# Patient Record
Sex: Male | Born: 1950 | Race: White | Hispanic: No | Marital: Married | Smoking: Never smoker
Health system: Southern US, Community
[De-identification: ages and names within clinical notes are randomized; demographics above are authoritative.]

## PROBLEM LIST (undated history)

## (undated) DIAGNOSIS — A4902 Methicillin resistant Staphylococcus aureus infection, unspecified site: Secondary | ICD-10-CM

## (undated) DIAGNOSIS — I10 Essential (primary) hypertension: Secondary | ICD-10-CM

## (undated) DIAGNOSIS — K219 Gastro-esophageal reflux disease without esophagitis: Secondary | ICD-10-CM

## (undated) DIAGNOSIS — E7800 Pure hypercholesterolemia, unspecified: Secondary | ICD-10-CM

## (undated) DIAGNOSIS — M1990 Unspecified osteoarthritis, unspecified site: Secondary | ICD-10-CM

## (undated) DIAGNOSIS — D649 Anemia, unspecified: Secondary | ICD-10-CM

## (undated) DIAGNOSIS — J343 Hypertrophy of nasal turbinates: Secondary | ICD-10-CM

## (undated) DIAGNOSIS — J342 Deviated nasal septum: Secondary | ICD-10-CM

## (undated) HISTORY — PX: WISDOM TOOTH EXTRACTION: SHX21

## (undated) HISTORY — PX: COLONOSCOPY: SHX174

---

## 2017-04-02 ENCOUNTER — Ambulatory Visit (INDEPENDENT_AMBULATORY_CARE_PROVIDER_SITE_OTHER): Payer: Medicare HMO

## 2017-04-02 ENCOUNTER — Encounter: Payer: Self-pay | Admitting: Podiatry

## 2017-04-02 ENCOUNTER — Ambulatory Visit (INDEPENDENT_AMBULATORY_CARE_PROVIDER_SITE_OTHER): Payer: Medicare HMO | Admitting: Podiatry

## 2017-04-02 DIAGNOSIS — M79671 Pain in right foot: Secondary | ICD-10-CM

## 2017-04-02 DIAGNOSIS — M779 Enthesopathy, unspecified: Secondary | ICD-10-CM

## 2017-04-02 NOTE — Progress Notes (Signed)
   Subjective:    Patient ID: Ryan Berger, male    DOB: 07/14/1951, 66 y.o.   MRN: [id number]  HPI Chief Complaint  Patient presents with  . Plantar Fasciitis    Rt foot heel/arch/lateral side is painful for over 1 week      Review of Systems  All other systems reviewed and are negative.      Objective:   Physical Exam        Assessment & Plan:

## 2017-04-02 NOTE — Progress Notes (Signed)
Subjective:    Patient ID: Ryan SimasShawn Berger, male   DOB: 66 y.o.   MRN: [id number]   HPI patient presents stating he's develop a lot of pain on the top outside of his right foot over the last month and he's not sure about injury. States the pain has moderated some but it still swelling and he wanted to get it checked    Review of Systems  All other systems reviewed and are negative.       Objective:  Physical Exam  Cardiovascular: Intact distal pulses.   Pulmonary/Chest: Breath sounds normal.  Musculoskeletal: Normal range of motion.  Neurological: He is alert.  Skin: Skin is warm.  Nursing note and vitals reviewed.  neurovascular status intact muscle strength adequate range of motion within normal limits with patient found to have edema with mild pain and irritation around the peroneal tendon group lateral side right ankle with no indication of tendon dysfunction. Patient's found have good digital perfusion and is well oriented 3     Assessment:   Inflammatory tendinitis lateral side right foot     Plan:   H&P condition reviewed and today I advised on aggressive ice therapy I dispensed a fascial brace to lift up the lateral side of the foot and I advised on reduced activity. Reappoint for us to recheck  X-rays taken today negative for signs of fracture or arthritic condition

## 2018-09-23 DIAGNOSIS — A4902 Methicillin resistant Staphylococcus aureus infection, unspecified site: Secondary | ICD-10-CM

## 2018-09-23 HISTORY — DX: Methicillin resistant Staphylococcus aureus infection, unspecified site: A49.02

## 2018-10-07 ENCOUNTER — Encounter (HOSPITAL_BASED_OUTPATIENT_CLINIC_OR_DEPARTMENT_OTHER): Payer: Self-pay | Admitting: *Deleted

## 2018-10-07 ENCOUNTER — Other Ambulatory Visit: Payer: Self-pay | Admitting: Otolaryngology

## 2018-10-07 ENCOUNTER — Other Ambulatory Visit: Payer: Self-pay

## 2018-10-07 NOTE — Progress Notes (Signed)
Patient states he is currently being treated for a MRSA infection on his wasteline with Doxycycline and topical mupirocin. Heather at Dr Avel Sensor office notified and she stated that he is already aware.

## 2018-11-23 ENCOUNTER — Encounter (HOSPITAL_BASED_OUTPATIENT_CLINIC_OR_DEPARTMENT_OTHER): Payer: Self-pay | Admitting: *Deleted

## 2018-11-23 ENCOUNTER — Other Ambulatory Visit: Payer: Self-pay

## 2018-11-24 ENCOUNTER — Encounter (HOSPITAL_BASED_OUTPATIENT_CLINIC_OR_DEPARTMENT_OTHER)
Admission: RE | Admit: 2018-11-24 | Discharge: 2018-11-24 | Disposition: A | Discharge status: Home / Self Care | Payer: Medicare HMO | Source: Ambulatory Visit | Attending: Otolaryngology | Admitting: Otolaryngology

## 2018-11-24 DIAGNOSIS — Z01810 Encounter for preprocedural cardiovascular examination: Secondary | ICD-10-CM | POA: Insufficient documentation

## 2018-12-01 ENCOUNTER — Other Ambulatory Visit: Payer: Self-pay

## 2018-12-01 ENCOUNTER — Ambulatory Visit (HOSPITAL_BASED_OUTPATIENT_CLINIC_OR_DEPARTMENT_OTHER): Payer: Medicare HMO | Admitting: Certified Registered"

## 2018-12-01 ENCOUNTER — Encounter (HOSPITAL_BASED_OUTPATIENT_CLINIC_OR_DEPARTMENT_OTHER): Payer: Self-pay | Admitting: *Deleted

## 2018-12-01 ENCOUNTER — Encounter (HOSPITAL_BASED_OUTPATIENT_CLINIC_OR_DEPARTMENT_OTHER)
Admission: RE | Disposition: A | Discharge status: Home / Self Care | Payer: Self-pay | Source: Ambulatory Visit | Attending: Otolaryngology

## 2018-12-01 ENCOUNTER — Ambulatory Visit (HOSPITAL_BASED_OUTPATIENT_CLINIC_OR_DEPARTMENT_OTHER)
Admission: RE | Admit: 2018-12-01 | Discharge: 2018-12-01 | Disposition: A | Discharge status: Home / Self Care | Payer: Medicare HMO | Source: Ambulatory Visit | Attending: Otolaryngology | Admitting: Otolaryngology

## 2018-12-01 DIAGNOSIS — M1990 Unspecified osteoarthritis, unspecified site: Secondary | ICD-10-CM | POA: Insufficient documentation

## 2018-12-01 DIAGNOSIS — J3489 Other specified disorders of nose and nasal sinuses: Secondary | ICD-10-CM | POA: Insufficient documentation

## 2018-12-01 DIAGNOSIS — J343 Hypertrophy of nasal turbinates: Secondary | ICD-10-CM | POA: Insufficient documentation

## 2018-12-01 DIAGNOSIS — J342 Deviated nasal septum: Secondary | ICD-10-CM | POA: Insufficient documentation

## 2018-12-01 DIAGNOSIS — K219 Gastro-esophageal reflux disease without esophagitis: Secondary | ICD-10-CM | POA: Insufficient documentation

## 2018-12-01 DIAGNOSIS — I10 Essential (primary) hypertension: Secondary | ICD-10-CM | POA: Insufficient documentation

## 2018-12-01 DIAGNOSIS — Z79899 Other long term (current) drug therapy: Secondary | ICD-10-CM | POA: Insufficient documentation

## 2018-12-01 HISTORY — DX: Deviated nasal septum: J34.2

## 2018-12-01 HISTORY — DX: Anemia, unspecified: D64.9

## 2018-12-01 HISTORY — PX: NASAL SEPTOPLASTY W/ TURBINOPLASTY: SHX2070

## 2018-12-01 HISTORY — DX: Gastro-esophageal reflux disease without esophagitis: K21.9

## 2018-12-01 HISTORY — DX: Pure hypercholesterolemia, unspecified: E78.00

## 2018-12-01 HISTORY — DX: Methicillin resistant Staphylococcus aureus infection, unspecified site: A49.02

## 2018-12-01 HISTORY — DX: Essential (primary) hypertension: I10

## 2018-12-01 HISTORY — DX: Hypertrophy of nasal turbinates: J34.3

## 2018-12-01 HISTORY — DX: Unspecified osteoarthritis, unspecified site: M19.90

## 2018-12-01 SURGERY — SEPTOPLASTY, NOSE, WITH NASAL TURBINATE REDUCTION
Anesthesia: General | Site: Nose | Laterality: Bilateral

## 2018-12-01 MED ORDER — DEXAMETHASONE SODIUM PHOSPHATE 10 MG/ML IJ SOLN
INTRAMUSCULAR | Status: AC
  Filled 2018-12-01: qty 1

## 2018-12-01 MED ORDER — OXYMETAZOLINE HCL 0.05 % NA SOLN
NASAL | Status: DC | PRN
  Administered 2018-12-01: 1 via TOPICAL

## 2018-12-01 MED ORDER — ONDANSETRON HCL 4 MG/2ML IJ SOLN
INTRAMUSCULAR | Status: DC | PRN
  Administered 2018-12-01: 4 mg via INTRAVENOUS

## 2018-12-01 MED ORDER — DEXAMETHASONE SODIUM PHOSPHATE 4 MG/ML IJ SOLN
INTRAMUSCULAR | Status: DC | PRN
  Administered 2018-12-01: 10 mg via INTRAVENOUS

## 2018-12-01 MED ORDER — LIDOCAINE HCL (CARDIAC) PF 100 MG/5ML IV SOSY
PREFILLED_SYRINGE | INTRAVENOUS | Status: DC | PRN
  Administered 2018-12-01: 60 mg via INTRAVENOUS

## 2018-12-01 MED ORDER — MIDAZOLAM HCL 2 MG/2ML IJ SOLN
INTRAMUSCULAR | Status: AC
  Filled 2018-12-01: qty 2

## 2018-12-01 MED ORDER — ACETAMINOPHEN 10 MG/ML IV SOLN: 1000.0000 mg | Freq: Once | INTRAVENOUS | Status: DC | PRN

## 2018-12-01 MED ORDER — FENTANYL CITRATE (PF) 100 MCG/2ML IJ SOLN
INTRAMUSCULAR | Status: AC
  Filled 2018-12-01: qty 2

## 2018-12-01 MED ORDER — PROPOFOL 10 MG/ML IV BOLUS
INTRAVENOUS | Status: DC | PRN
  Administered 2018-12-01: 140 mg via INTRAVENOUS

## 2018-12-01 MED ORDER — ROCURONIUM BROMIDE 100 MG/10ML IV SOLN
INTRAVENOUS | Status: DC | PRN
  Administered 2018-12-01: 50 mg via INTRAVENOUS

## 2018-12-01 MED ORDER — FENTANYL CITRATE (PF) 100 MCG/2ML IJ SOLN
25.0000 ug | INTRAMUSCULAR | Status: DC | PRN
  Administered 2018-12-01 (×2): 25 ug via INTRAVENOUS

## 2018-12-01 MED ORDER — LIDOCAINE-EPINEPHRINE 1 %-1:100000 IJ SOLN
INTRAMUSCULAR | Status: DC | PRN
  Administered 2018-12-01: 5 mL

## 2018-12-01 MED ORDER — ACETAMINOPHEN 160 MG/5ML PO SOLN: 1000.0000 mg | Freq: Once | ORAL | Status: DC | PRN

## 2018-12-01 MED ORDER — ACETAMINOPHEN 500 MG PO TABS: 1000.0000 mg | ORAL_TABLET | Freq: Once | ORAL | Status: DC | PRN

## 2018-12-01 MED ORDER — FENTANYL CITRATE (PF) 100 MCG/2ML IJ SOLN
50.0000 ug | INTRAMUSCULAR | Status: DC | PRN
  Administered 2018-12-01: 100 ug via INTRAVENOUS

## 2018-12-01 MED ORDER — SUGAMMADEX SODIUM 200 MG/2ML IV SOLN
INTRAVENOUS | Status: DC | PRN
  Administered 2018-12-01: 200 mg via INTRAVENOUS

## 2018-12-01 MED ORDER — PROPOFOL 500 MG/50ML IV EMUL
INTRAVENOUS | Status: AC
  Filled 2018-12-01: qty 50

## 2018-12-01 MED ORDER — ONDANSETRON HCL 4 MG/2ML IJ SOLN
INTRAMUSCULAR | Status: AC
  Filled 2018-12-01: qty 2

## 2018-12-01 MED ORDER — CEFAZOLIN SODIUM-DEXTROSE 2-3 GM-%(50ML) IV SOLR
INTRAVENOUS | Status: DC | PRN
  Administered 2018-12-01: 2 g via INTRAVENOUS

## 2018-12-01 MED ORDER — AMOXICILLIN 875 MG PO TABS: 875.0000 mg | ORAL_TABLET | Freq: Two times a day (BID) | ORAL | 0 refills | Status: AC

## 2018-12-01 MED ORDER — SCOPOLAMINE 1 MG/3DAYS TD PT72: 1.0000 | MEDICATED_PATCH | Freq: Once | TRANSDERMAL | Status: DC | PRN

## 2018-12-01 MED ORDER — EPHEDRINE SULFATE 50 MG/ML IJ SOLN
INTRAMUSCULAR | Status: DC | PRN
  Administered 2018-12-01: 10 mg via INTRAVENOUS

## 2018-12-01 MED ORDER — MUPIROCIN 2 % EX OINT
TOPICAL_OINTMENT | CUTANEOUS | Status: DC | PRN
  Administered 2018-12-01: 1 via TOPICAL

## 2018-12-01 MED ORDER — MIDAZOLAM HCL 2 MG/2ML IJ SOLN
1.0000 mg | INTRAMUSCULAR | Status: DC | PRN
  Administered 2018-12-01: 2 mg via INTRAVENOUS

## 2018-12-01 MED ORDER — OXYCODONE HCL 5 MG/5ML PO SOLN: 5.0000 mg | Freq: Once | ORAL | Status: DC | PRN

## 2018-12-01 MED ORDER — LACTATED RINGERS IV SOLN
INTRAVENOUS | Status: DC
  Administered 2018-12-01 (×2): via INTRAVENOUS

## 2018-12-01 MED ORDER — OXYCODONE-ACETAMINOPHEN 5-325 MG PO TABS: 1.0000 | ORAL_TABLET | ORAL | 0 refills | Status: AC | PRN

## 2018-12-01 MED ORDER — OXYCODONE HCL 5 MG PO TABS: 5.0000 mg | ORAL_TABLET | Freq: Once | ORAL | Status: DC | PRN

## 2018-12-01 SURGICAL SUPPLY — 38 items
ATTRACTOMAT 16X20 MAGNETIC DRP (DRAPES) IMPLANT
CANISTER SUCT 1200ML W/VALVE (MISCELLANEOUS) ×3 IMPLANT
COAGULATOR SUCT 8FR VV (MISCELLANEOUS) ×3 IMPLANT
COVER WAND RF STERILE (DRAPES) IMPLANT
DECANTER SPIKE VIAL GLASS SM (MISCELLANEOUS) IMPLANT
DRSG NASOPORE 8CM (GAUZE/BANDAGES/DRESSINGS) IMPLANT
DRSG TELFA 3X8 NADH (GAUZE/BANDAGES/DRESSINGS) IMPLANT
ELECT REM PT RETURN 9FT ADLT (ELECTROSURGICAL) ×3
ELECTRODE REM PT RTRN 9FT ADLT (ELECTROSURGICAL) ×1 IMPLANT
GLOVE BIO SURGEON STRL SZ 6.5 (GLOVE) ×2 IMPLANT
GLOVE BIO SURGEON STRL SZ7.5 (GLOVE) ×3 IMPLANT
GLOVE BIO SURGEONS STRL SZ 6.5 (GLOVE) ×1
GLOVE BIOGEL PI IND STRL 7.0 (GLOVE) ×2 IMPLANT
GLOVE BIOGEL PI INDICATOR 7.0 (GLOVE) ×4
GLOVE ECLIPSE 6.5 STRL STRAW (GLOVE) ×3 IMPLANT
GOWN STRL REUS W/ TWL LRG LVL3 (GOWN DISPOSABLE) ×2 IMPLANT
GOWN STRL REUS W/ TWL XL LVL3 (GOWN DISPOSABLE) ×1 IMPLANT
GOWN STRL REUS W/TWL LRG LVL3 (GOWN DISPOSABLE) ×4
GOWN STRL REUS W/TWL XL LVL3 (GOWN DISPOSABLE) ×2
NEEDLE HYPO 25X1 1.5 SAFETY (NEEDLE) ×3 IMPLANT
NS IRRIG 1000ML POUR BTL (IV SOLUTION) ×3 IMPLANT
PACK BASIN DAY SURGERY FS (CUSTOM PROCEDURE TRAY) ×3 IMPLANT
PACK ENT DAY SURGERY (CUSTOM PROCEDURE TRAY) ×3 IMPLANT
SLEEVE SCD COMPRESS KNEE MED (MISCELLANEOUS) ×3 IMPLANT
SOLUTION BUTLER CLEAR DIP (MISCELLANEOUS) ×3 IMPLANT
SPLINT NASAL AIRWAY SILICONE (MISCELLANEOUS) ×3 IMPLANT
SPONGE GAUZE 2X2 8PLY STER LF (GAUZE/BANDAGES/DRESSINGS) ×1
SPONGE GAUZE 2X2 8PLY STRL LF (GAUZE/BANDAGES/DRESSINGS) ×2 IMPLANT
SPONGE NEURO XRAY DETECT 1X3 (DISPOSABLE) ×3 IMPLANT
SUT CHROMIC 4 0 P 3 18 (SUTURE) ×3 IMPLANT
SUT PLAIN 4 0 ~~LOC~~ 1 (SUTURE) ×3 IMPLANT
SUT PROLENE 3 0 PS 2 (SUTURE) ×3 IMPLANT
SUT VIC AB 4-0 P-3 18XBRD (SUTURE) IMPLANT
SUT VIC AB 4-0 P3 18 (SUTURE)
TOWEL GREEN STERILE FF (TOWEL DISPOSABLE) ×3 IMPLANT
TUBE SALEM SUMP 12R W/ARV (TUBING) IMPLANT
TUBE SALEM SUMP 16 FR W/ARV (TUBING) ×3 IMPLANT
YANKAUER SUCT BULB TIP NO VENT (SUCTIONS) ×3 IMPLANT

## 2018-12-01 NOTE — Op Note (Signed)
DATE OF PROCEDURE: 12/01/2018  OPERATIVE REPORT   SURGEON: Newman Pies, MD   PREOPERATIVE DIAGNOSES:  1. Severe nasal septal deviation.  2. Bilateral inferior turbinate hypertrophy.  3. Chronic nasal obstruction.  POSTOPERATIVE DIAGNOSES:  1. Severe nasal septal deviation.  2. Bilateral inferior turbinate hypertrophy.  3. Chronic nasal obstruction.  PROCEDURE PERFORMED:  1. Septoplasty.  2. Bilateral partial inferior turbinate resection.   ANESTHESIA: General endotracheal tube anesthesia.   COMPLICATIONS: None.   ESTIMATED BLOOD LOSS: 200 mL.   INDICATION FOR PROCEDURE: Ryan Berger is a 68 y.o. male with a history of chronic nasal obstruction. The patient was treated with antihistamine, decongestant, and steroid nasal sprays. However, the patient continued to be symptomatic. On examination, the patient was noted to have bilateral severe inferior turbinate hypertrophy and significant nasal septal deviation, causing significant nasal obstruction. Based on the above findings, the decision was made for the patient to undergo the above-stated procedures. The risks, benefits, alternatives, and details of the procedures were discussed with the patient. Questions were invited and answered. Informed consent was obtained.   DESCRIPTION OF PROCEDURE: The patient was taken to the operating room and placed supine on the operating table. General endotracheal tube anesthesia was administered by the anesthesiologist. The patient was positioned, and prepped and draped in the standard fashion for nasal surgery. Pledgets soaked with Afrin were placed in both nasal cavities for decongestion. The pledgets were subsequently removed. The FUSION stereotactic image guidance marker was placed. The image guidance system was functional throughout the case.  Examination of the nasal cavity revealed a severe nasal septal deviationd. 1% lidocaine with 1:100,000 epinephrine was injected onto the nasal septum  bilaterally. A hemitransfixion incision was made on the left side. The mucosal flap was carefully elevated on the left side. A cartilaginous incision was made 1 cm superior to the caudal margin of the nasal septum. Mucosal flap was also elevated on the right side in the similar fashion. It should be noted that due to the severe septal deviation, the deviated portion of the cartilaginous and bony septum had to be removed in piecemeal fashion. Once the deviated portions were removed, a straight midline septum was achieved. The septum was then quilted with 4-0 plain gut sutures. The hemitransfixion incision was closed with interrupted 4-0 chromic sutures.   The inferior one half of both hypertrophied inferior turbinate was crossclamped with a Kelly clamp. The inferior one half of each inferior turbinate was then resected with a pair of cross cutting scissors. Hemostasis was achieved with a suction cautery device. Doyle splints were applied to the nasal septum.  The care of the patient was turned over to the anesthesiologist. The patient was awakened from anesthesia without difficulty. The patient was extubated and transferred to the recovery room in good condition.   OPERATIVE FINDINGS: Severe nasal septal deviation and bilateral inferior turbinate hypertrophy.   SPECIMEN: None.   FOLLOWUP CARE: The patient be discharged home once he is awake and alert. The patient will be placed on Percocet 1 tablets p.o. q.4 hours p.r.n. pain, and amoxicillin 875 mg p.o. b.i.d. for 5 days. The patient will follow up in my office in 3 days for splint removal.   Ryan Perleberg Philomena Doheny, MD

## 2018-12-01 NOTE — Anesthesia Preprocedure Evaluation (Addendum)
Anesthesia Evaluation  Patient identified by MRN, date of birth, ID band Patient awake    Reviewed: Allergy & Precautions, NPO status , Patient's Chart, lab work & pertinent test results  History of Anesthesia Complications Negative for: history of anesthetic complications  Airway Mallampati: I  TM Distance: >3 FB Neck ROM: Full    Dental  (+) Teeth Intact   Pulmonary neg pulmonary ROS,    breath sounds clear to auscultation       Cardiovascular hypertension, Pt. on medications  Rhythm:Regular     Neuro/Psych negative neurological ROS  negative psych ROS   GI/Hepatic Neg liver ROS, GERD  Medicated and Controlled,  Endo/Other  negative endocrine ROS  Renal/GU negative Renal ROS     Musculoskeletal  (+) Arthritis ,   Abdominal   Peds  Hematology negative hematology ROS (+)   Anesthesia Other Findings   Reproductive/Obstetrics                            Anesthesia Physical Anesthesia Plan  ASA: II  Anesthesia Plan: General   Post-op Pain Management:    Induction: Intravenous  PONV Risk Score and Plan: 2 and Ondansetron and Dexamethasone  Airway Management Planned: Oral ETT  Additional Equipment: None  Intra-op Plan:   Post-operative Plan: Extubation in OR  Informed Consent: I have reviewed the patients History and Physical, chart, labs and discussed the procedure including the risks, benefits and alternatives for the proposed anesthesia with the patient or authorized representative who has indicated his/her understanding and acceptance.     Dental advisory given  Plan Discussed with: CRNA and Surgeon  Anesthesia Plan Comments:         Anesthesia Quick Evaluation

## 2018-12-01 NOTE — H&P (Signed)
Cc: Chronic nasal obstruction  HPI: The patient is a 68 year old male who returns today for his follow-up evaluation. The patient has a history of chronic nasal obstruction.  He was last seen in 06/2018.  At that time, he was noted to have severe nasal septal deviation and bilateral inferior turbinate hypertrophy.  More than 95% of his nasal passageways was obstructed.  The patient was treated conservatively with allergy medications, steroid nasal sprays, and decongestant.  However, he continues to be symptomatic.  The patient is interested in proceeding with surgical intervention.  In addition, the patient also reports he has a possible skin infection on his right lower abdominal wall.  He is currently being treated with Bactroban ointment by his primary care physician.  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  He was successfully treated with doxycycline. No other ENT, GI, or respiratory issue noted since the last visit.   Exam General: Communicates without difficulty, well nourished, no acute distress. Head: Normocephalic, no evidence injury, no tenderness, facial buttresses intact without stepoff. Eyes: PERRL, EOMI. No scleral icterus, conjunctivae clear. Neuro: CN II exam reveals vision grossly intact.  No nystagmus at any point of gaze. Ears: Auricles well formed without lesions.  Ear canals are intact without mass or lesion.  No erythema or edema is appreciated.  The TMs are intact without fluid. Nose: External evaluation reveals normal support and skin without lesions.  Dorsum is intact.  Anterior rhinoscopy reveals congested and edematous mucosa over anterior aspect of the inferior turbinates and deviated nasal septum.  No purulence is noted. Middle meatus is not well visualized. Oral:  Oral cavity and oropharynx are intact, symmetric, without erythema or edema.  Mucosa is moist without lesions. Neck: Full range of motion without pain.  There is no significant  lymphadenopathy.  No masses palpable.  Thyroid bed within normal limits to palpation.  Parotid glands and submandibular glands equal bilaterally without mass.  Trachea is midline. Neuro:  CN 2-12 grossly intact. Gait normal. Vestibular: No nystagmus at any point of gaze.   Assessment 1.  Persistent nasal obstruction, secondary to severe nasal septal deviation and bilateral inferior turbinate hypertrophy.    Plan  1.  The physical exam findings are reviewed with the patient.  2.  The patient may benefit from undergoing septoplasty and turbinate reduction surgery to improve his nasal passageways.  The risks, benefits and details of the procedures are extensively reviewed with the patient.

## 2018-12-01 NOTE — Discharge Instructions (Addendum)

## 2018-12-01 NOTE — Anesthesia Procedure Notes (Signed)
Procedure Name: Intubation Date/Time: 12/01/2018 8:45 AM Performed by: Maryella Shivers, CRNA Pre-anesthesia Checklist: Patient identified, Emergency Drugs available, Suction available and Patient being monitored Patient Re-evaluated:Patient Re-evaluated prior to induction Oxygen Delivery Method: Circle system utilized Preoxygenation: Pre-oxygenation with 100% oxygen Induction Type: IV induction Ventilation: Mask ventilation without difficulty Laryngoscope Size: Mac and 4 Grade View: Grade I Tube type: Oral Rae Tube size: 8.0 mm Number of attempts: 1 Airway Equipment and Method: Stylet and Oral airway Placement Confirmation: ETT inserted through vocal cords under direct vision,  positive ETCO2 and breath sounds checked- equal and bilateral Secured at: 22 cm Tube secured with: Tape Dental Injury: Teeth and Oropharynx as per pre-operative assessment

## 2018-12-01 NOTE — Transfer of Care (Signed)
Immediate Anesthesia Transfer of Care Note  Patient: Ryan Berger  Procedure(s) Performed: NASAL SEPTOPLASTY WITH TURBINATE REDUCTION (Bilateral Nose)  Patient Location: PACU  Anesthesia Type:General  Level of Consciousness: sedated  Airway & Oxygen Therapy: Patient Spontanous Breathing and Patient connected to face mask oxygen  Post-op Assessment: Report given to RN and Post -op Vital signs reviewed and stable  Post vital signs: Reviewed and stable  Last Vitals:  Vitals Value Taken Time  BP 148/78 12/01/2018 10:00 AM  Temp    Pulse 85 12/01/2018 10:01 AM  Resp 15 12/01/2018 10:01 AM  SpO2 94 % 12/01/2018 10:01 AM  Vitals shown include unvalidated device data.  Last Pain:  Vitals:   12/01/18 0749  TempSrc: Oral  PainSc: 0-No pain      Patients Stated Pain Goal: 0 (12/01/18 0749)  Complications: No apparent anesthesia complications

## 2018-12-02 ENCOUNTER — Encounter (HOSPITAL_BASED_OUTPATIENT_CLINIC_OR_DEPARTMENT_OTHER): Payer: Self-pay | Admitting: Otolaryngology

## 2018-12-03 NOTE — Anesthesia Postprocedure Evaluation (Signed)
Anesthesia Post Note  Patient: Ryan Berger  Procedure(s) Performed: NASAL SEPTOPLASTY WITH TURBINATE REDUCTION (Bilateral Nose)     Patient location during evaluation: PACU Anesthesia Type: General Level of consciousness: awake and alert Pain management: pain level controlled Vital Signs Assessment: post-procedure vital signs reviewed and stable Respiratory status: spontaneous breathing, nonlabored ventilation, respiratory function stable and patient connected to nasal cannula oxygen Cardiovascular status: blood pressure returned to baseline and stable Postop Assessment: no apparent nausea or vomiting Anesthetic complications: no    Last Vitals:  Vitals:   12/01/18 1100 12/01/18 1110  BP: (!) 161/83 (!) 169/94  Pulse: 83 86  Resp: (!) 9 16  Temp:  36.6 C  SpO2: 96% 97%    Last Pain:  Vitals:   12/01/18 1110  TempSrc:   PainSc: 2                  Sharisa Toves

## 2021-07-31 ENCOUNTER — Other Ambulatory Visit: Payer: Self-pay | Admitting: Urology

## 2021-07-31 DIAGNOSIS — R972 Elevated prostate specific antigen [PSA]: Secondary | ICD-10-CM

## 2021-08-22 ENCOUNTER — Ambulatory Visit (HOSPITAL_COMMUNITY)
Admission: RE | Admit: 2021-08-22 | Discharge: 2021-08-22 | Disposition: A | Discharge status: Home / Self Care | Payer: Medicare HMO | Source: Ambulatory Visit | Attending: Orthopedic Surgery | Admitting: Orthopedic Surgery

## 2021-08-22 ENCOUNTER — Other Ambulatory Visit: Payer: Self-pay

## 2021-08-22 ENCOUNTER — Other Ambulatory Visit (HOSPITAL_COMMUNITY): Payer: Self-pay | Admitting: Physician Assistant

## 2021-08-22 DIAGNOSIS — M79605 Pain in left leg: Secondary | ICD-10-CM

## 2021-08-26 ENCOUNTER — Ambulatory Visit
Admission: RE | Admit: 2021-08-26 | Discharge: 2021-08-26 | Disposition: A | Discharge status: Home / Self Care | Payer: Medicare HMO | Source: Ambulatory Visit | Attending: Urology | Admitting: Urology

## 2021-08-26 DIAGNOSIS — R972 Elevated prostate specific antigen [PSA]: Secondary | ICD-10-CM

## 2021-08-26 MED ORDER — GADOBENATE DIMEGLUMINE 529 MG/ML IV SOLN
20.0000 mL | Freq: Once | INTRAVENOUS | Status: AC | PRN
  Administered 2021-08-26: 20 mL via INTRAVENOUS

## 2022-08-02 IMAGING — MR MR PROSTATE WO/W CM
12 series · 48 of 48 positions shown · IV contrast (multihance)
Comparison: None.

CLINICAL DATA: Elevated PSA.

EXAM:
MR PROSTATE WITHOUT AND WITH CONTRAST
TECHNIQUE: Multiplanar multisequence MRI images were obtained of the pelvis
centered about the prostate. Pre and post contrast images were
obtained.
CONTRAST:  20mL MULTIHANCE GADOBENATE DIMEGLUMINE 529 MG/ML IV SOLN

[Series 3: T2 · coronal · 3.0mm · 0.56mm/px · 1 of 23 slices shown (1 of 3)]
[im 1/23]
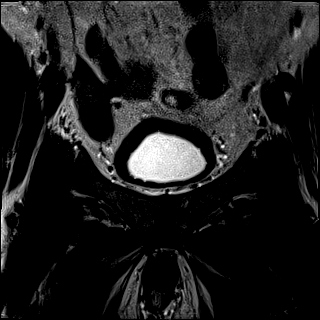

[Series 4: T1 · axial · 5.0mm · 1.25mm/px · z∈[-10,+185]mm · 2 of 80 slices shown]
[im 1/80]
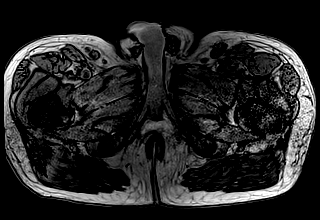
[im 80/80]
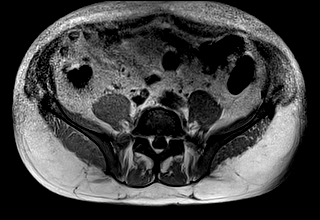

[Series 5: DWI · axial · 3.0mm · 1.75mm/px · z∈[+6,+81]mm · 2 of 78 slices shown (1 of 3)]
[im 1/78]
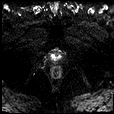
[im 78/78]
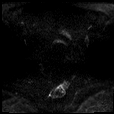

[Series 6: DWI · axial · 3.0mm · 1.75mm/px · 1 of 26 slices shown (2 of 3)]
[im 1/26]
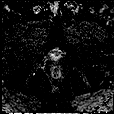

[Series 7: DWI · axial · 3.0mm · 1.75mm/px · 1 of 26 slices shown (3 of 3)]
[im 1/26]
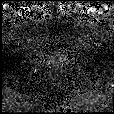

[Series 8: T2 · axial · 3.0mm · 0.56mm/px · 1 of 26 slices shown (2 of 3)]
[im 1/26]
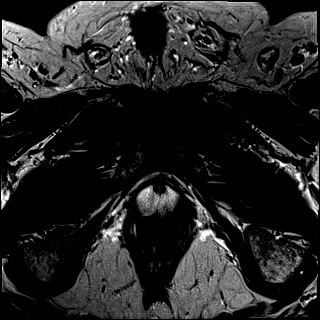

[Series 9: T2 · axial · 1.0mm · 1.04mm/px · z∈[+0,+79]mm · 2 of 80 slices shown (3 of 3)]
[im 1/80]
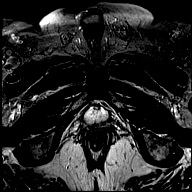
[im 80/80]
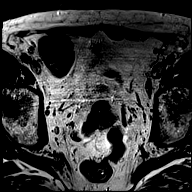

[Series 10: pre t1_twist_tra_dyn · axial · non-contrast · 3.5mm · 0.83mm/px · 1 of 20 slices shown]
[im 1/20]
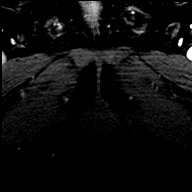

[Series 11: post t1_twist_tra_dyn-copy center · axial · non-contrast · 3.5mm · 0.83mm/px · z∈[+9,+76]mm · 17 of 600 slices shown]
[im 1/600]
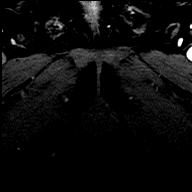
[im 38/600]
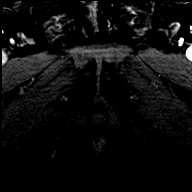
[im 75/600]
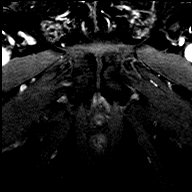
[im 113/600]
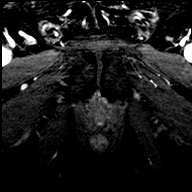
[im 150/600]
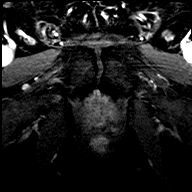
[im 188/600]
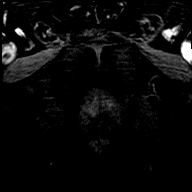
[im 225/600]
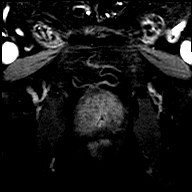
[im 263/600]
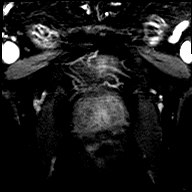
[im 300/600]
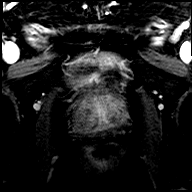
[im 337/600]
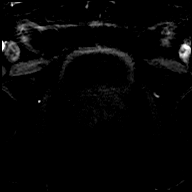
[im 375/600]
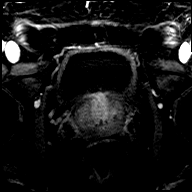
[im 412/600]
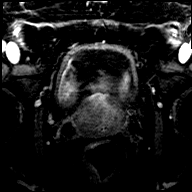
[im 450/600]
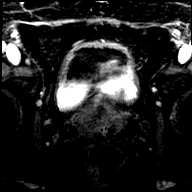
[im 487/600]
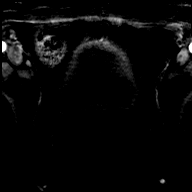
[im 525/600]
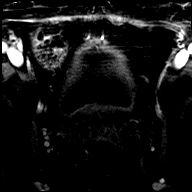
[im 562/600]
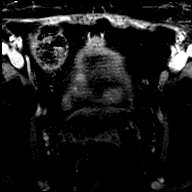
[im 600/600]
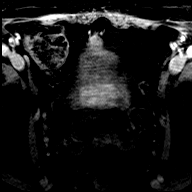

[Series 12: post t1_twist_tra_dyn-copy cent_sub · axial · 3.5mm · 0.83mm/px · z∈[+9,+76]mm · 16 of 580 slices shown]
[im 1/580]
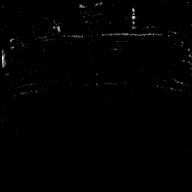
[im 39/580]
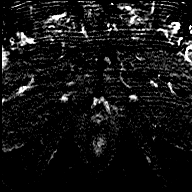
[im 78/580]
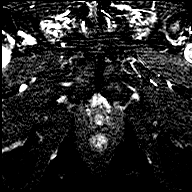
[im 116/580]
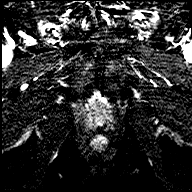
[im 155/580]
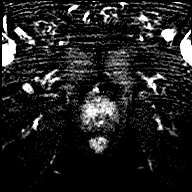
[im 194/580]
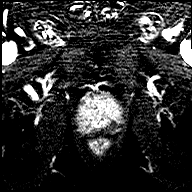
[im 232/580]
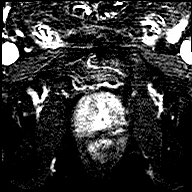
[im 271/580]
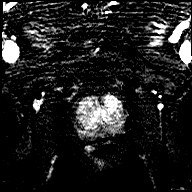
[im 309/580]
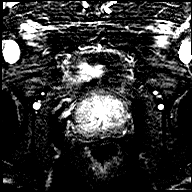
[im 348/580]
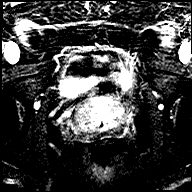
[im 387/580]
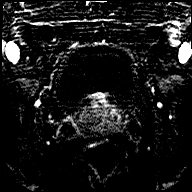
[im 425/580]
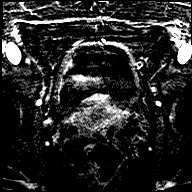
[im 464/580]
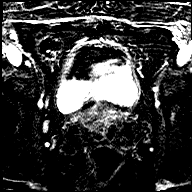
[im 502/580]
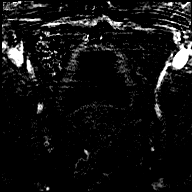
[im 541/580]
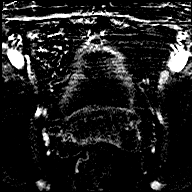
[im 580/580]
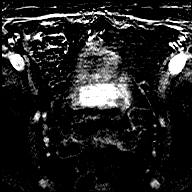

[Series 13: t1_vibe_dixon_tra_f · axial · 2.5mm · 0.91mm/px · z∈[-14,+184]mm · 2 of 80 slices shown]
[im 1/80]
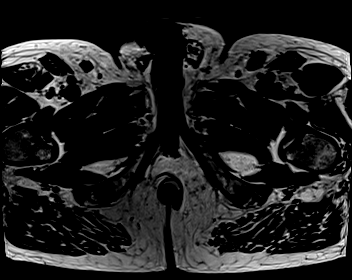
[im 80/80]
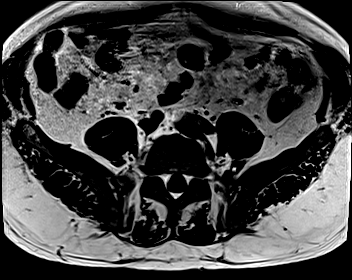

[Series 14: t1_vibe_dixon_tra_w · axial · 2.5mm · 0.91mm/px · z∈[-14,+184]mm · 2 of 80 slices shown]
[im 1/80]
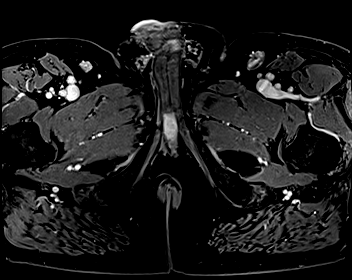
[im 80/80]
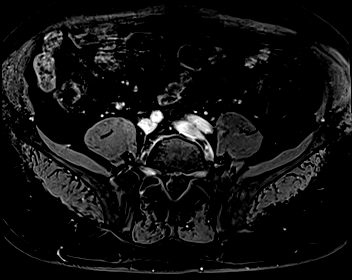

[48 of 48 positions shown; findings below may reference images not displayed]

FINDINGS: Prostate:

-- Peripheral Zone: Linear/wedge shaped hypointensities are noted on
ADC; however, no focal ADC hypointense or high b-value DWI
hyperintense nodules are identified.

-- Transition/Central Zone: Mildly enlarged with encapsulated BPH
nodules noted; however, no suspicious nodules are identified on
T2-weighted or diffusion sequences.

-- Measurements/Volume:  5.3 by 4.0 x 4.8 cm (volume = 53 cm^3)

Transcapsular spread:  Absent

Seminal vesicle involvement:  Absent

Neurovascular bundle involvement:  Absent

Pelvic adenopathy: None visualized

Bone metastasis: None visualized

Other:  None
IMPRESSION: No radiographic evidence of high-grade prostate carcinoma. PI-RADS 2
(v.2.1): Low (clinically significant cancer unlikely)

## 2023-04-04 ENCOUNTER — Other Ambulatory Visit: Payer: Self-pay | Admitting: Urology

## 2023-10-17 ENCOUNTER — Encounter: Payer: Self-pay | Admitting: Cardiology

## 2023-10-17 ENCOUNTER — Ambulatory Visit: Payer: Medicare HMO | Attending: Cardiology | Admitting: Cardiology

## 2023-10-17 VITALS — BP 120/80 | HR 76 | Ht 72.0 in | Wt 222.6 lb

## 2023-10-17 DIAGNOSIS — I10 Essential (primary) hypertension: Secondary | ICD-10-CM | POA: Diagnosis not present

## 2023-10-17 DIAGNOSIS — I77810 Thoracic aortic ectasia: Secondary | ICD-10-CM | POA: Diagnosis not present

## 2023-10-17 MED ORDER — ATORVASTATIN CALCIUM 20 MG PO TABS: 20.0000 mg | ORAL_TABLET | Freq: Every day | ORAL | 3 refills | Status: DC

## 2023-10-17 NOTE — Patient Instructions (Signed)
Medication Instructions:  Increase atorvastatin dose to 20 mg by mouth daily. New script sent. *If you need a refill on your cardiac medications before your next appointment, please call your pharmacy*   Lab Work: Fasting Lipid profile in 3 months.  If you have labs (blood work) drawn today and your tests are completely normal, you will receive your results only by: MyChart Message (if you have MyChart) OR A paper copy in the mail If you have any lab test that is abnormal or we need to change your treatment, we will call you to review the results.   Testing/Procedures: Non-Cardiac CT Angiography (CTA), is a special type of CT scan that uses a computer to produce multi-dimensional views of major blood vessels throughout the body. This test is due in December 2025. In CT angiography, a contrast material is injected through an IV to help visualize the blood vessels    Follow-Up: At [facility], you and your health needs are our priority.  As part of our continuing mission to provide you with exceptional heart care, we have created designated Provider Care Teams.  These Care Teams include your primary Cardiologist (physician) and Advanced Practice Providers (APPs -  Physician Assistants and Nurse Practitioners) who all work together to provide you with the care you need, when you need it.  We recommend signing up for the patient portal called "MyChart".  Sign up information is provided on this After Visit Summary.  MyChart is used to connect with patients for Virtual Visits (Telemedicine).  Patients are able to view lab/test results, encounter notes, upcoming appointments, etc.  Non-urgent messages can be sent to your provider as well.   To learn more about what you can do with MyChart, go to [email or web address].    Your next appointment:    As needed.   Provider:   Rollene Rotunda, MD

## 2023-10-17 NOTE — Progress Notes (Signed)
Cardiology Office Note   Date:  10/17/2023   ID:  Jaqualin, [name] [birth year], MRN [id number]  PCP:  Eartha Inch, MD  Cardiologist:   Rollene Rotunda, MD Referring:  Eartha Inch, MD   Chief Complaint  Patient presents with   Aortic root dilatation      History of Present Illness: Ryan Berger is a 73 y.o. male who presents for evaluation of ascending aortic dilatation.  This was seen on his CT that was done about.  This was a coronary calcium score.  I see this result and it was mentioned to be 40 mm.  His calcium score was only 17.  This was in his right coronary artery.  The patient has had no past cardiac history but this for screening purposes.  He is physically active.  He goes to the gym 3 times a week. The patient denies any new symptoms such as chest discomfort, neck or arm discomfort. There has been no new shortness of breath, PND or orthopnea. There have been no reported palpitations, presyncope or syncope.    Past Medical History:  Diagnosis Date   Anemia    low iron   Arthritis    Deviated septum    GERD (gastroesophageal reflux disease)    High cholesterol    Hypertension    MRSA infection 09/2018   waistline, treated    Nasal turbinate hypertrophy     Past Surgical History:  Procedure Laterality Date   COLONOSCOPY     NASAL SEPTOPLASTY W/ TURBINOPLASTY Bilateral 12/01/2018   Procedure: NASAL SEPTOPLASTY WITH TURBINATE REDUCTION;  Surgeon: Newman Pies, MD;  Location: [facility];  Service: ENT;  Laterality: Bilateral;   WISDOM TOOTH EXTRACTION       Current Outpatient Medications  Medication Sig Dispense Refill   atorvastatin (LIPITOR) 20 MG tablet Take 1 tablet (20 mg total) by mouth daily. 90 tablet 3   loratadine (CLARITIN) 10 MG tablet Take 10 mg by mouth daily.     losartan (COZAAR) 50 MG tablet 25 mg.      oxyCODONE-acetaminophen (PERCOCET) 5-325 MG tablet Take 1 tablet by mouth every 4 (four) hours as needed for severe  pain. 20 tablet 0   pantoprazole (PROTONIX) 40 MG tablet Take 40 mg by mouth daily.     phenylephrine (NEO-SYNEPHRINE) 0.5 % nasal solution Place 1 drop into both nostrils every 6 (six) hours as needed for congestion.     tamsulosin (FLOMAX) 0.4 MG CAPS capsule Take 0.4 mg by mouth.     No current facility-administered medications for this visit.    Allergies:   Patient has no known allergies.    Social History:  The patient  reports that he has never smoked. He has never used smokeless tobacco. He reports current alcohol use. He reports that he does not use drugs.   Family History:  The patient's family history includes Cerebral aneurysm in his mother.    ROS:  Please see the history of present illness.   Otherwise, review of systems are positive for none.   All other systems are reviewed and negative.    PHYSICAL EXAM: VS:  BP 120/80   Pulse 76   Ht 6' (1.829 m)   Wt 222 lb 9.6 oz (101 kg)   SpO2 97%   BMI 30.19 kg/m  , BMI Body mass index is 30.19 kg/m. GENERAL:  Well appearing HEENT:  Pupils equal round and reactive, fundi not visualized, oral mucosa  unremarkable NECK:  No jugular venous distention, waveform within normal limits, carotid upstroke brisk and symmetric, no bruits, no thyromegaly LYMPHATICS:  No cervical, inguinal adenopathy LUNGS:  Clear to auscultation bilaterally BACK:  No CVA tenderness CHEST:  Unremarkable HEART:  PMI not displaced or sustained,S1 and S2 within normal limits, no S3, no S4, no clicks, no rubs, no murmurs ABD:  Flat, positive bowel sounds normal in frequency in pitch, no bruits, no rebound, no guarding, no midline pulsatile mass, no hepatomegaly, no splenomegaly EXT:  2 plus pulses throughout, no edema, no cyanosis no clubbing SKIN:  No rashes no nodules NEURO:  Cranial nerves II through XII grossly intact, motor grossly intact throughout [facility]:  Cognitively intact, oriented to person place and time    EKG:  EKG  Interpretation Date/Time:  Friday October 17 2023 14:18:16 EST Ventricular Rate:  60 PR Interval:  174 QRS Duration:  106 QT Interval:  402 QTC Calculation: 402 R Axis:   -26  Text Interpretation: Normal sinus rhythm with sinus arrhythmia Normal ECG When compared with ECG of 24-Nov-2018 11:10, No significant change since last tracing Confirmed by Rollene Rotunda ([id number]) on 10/17/2023 2:48:40 PM     Recent Labs: No results found for requested labs within last 365 days.    Lipid Panel No results found for: "CHOL", "TRIG", "HDL", "CHOLHDL", "VLDL", "LDLCALC", "LDLDIRECT"    Wt Readings from Last 3 Encounters:  10/17/23 222 lb 9.6 oz (101 kg)  12/01/18 214 lb 11.7 oz (97.4 kg)      Other studies Reviewed: Additional studies/ records that were reviewed today include: Review of the results dictated on abdominal ultrasound and cardiac coronary calcium score. Review of the above records demonstrates:  Please see elsewhere in the note.     ASSESSMENT AND PLAN:  Aortic root dilatation: The patient has 40 mm root as seen on CT.  This was not contrasted.  I will order a contrasted CT for December of this year and follow-up on this as necessary.  Dyslipidemia: LDL is mildly elevated at 409.  HDL was 60.  I have suggested given some mild coronary calcium that is goal being an LDL of 70 and I increased his Lipitor to 20 mg daily and we will check a lipid profile in 3 months  Elevated coronary calcium: This is mild and he is having no symptoms.  He needs continued risk reduction.   Current medicines are reviewed at length with the patient today.  The patient does not have concerns regarding medicines.  The following changes have been made:  no change  Labs/ tests ordered today include:   Orders Placed This Encounter  Procedures   CT ANGIO CHEST AORTA W &/OR WO CONTRAST   Lipid Profile   EKG 12-Lead     Disposition:   FU with me as needed     Signed, Rollene Rotunda, MD   10/17/2023 3:14 PM    [facility]

## 2023-10-26 ENCOUNTER — Encounter: Payer: Self-pay | Admitting: Cardiology

## 2023-12-04 ENCOUNTER — Ambulatory Visit: Payer: Medicare HMO | Admitting: Cardiology

## 2024-05-29 LAB — LIPID PANEL
Chol/HDL Ratio: 2.3 ratio (ref 0.0–5.0)
Cholesterol, Total: 129 mg/dL (ref 100–199)
HDL: 55 mg/dL (ref >39)
LDL Chol Calc (NIH): 57 mg/dL (ref 0–99)
Triglycerides: 92 mg/dL (ref 0–149)
VLDL Cholesterol Cal: 17 mg/dL (ref 5–40)

## 2024-06-02 ENCOUNTER — Ambulatory Visit: Payer: Self-pay | Admitting: Cardiology

## 2024-08-30 ENCOUNTER — Ambulatory Visit (HOSPITAL_COMMUNITY)
Admission: RE | Admit: 2024-08-30 | Discharge: 2024-08-30 | Payer: Medicare HMO | Attending: Cardiology | Admitting: Cardiology

## 2024-08-30 DIAGNOSIS — I77810 Thoracic aortic ectasia: Secondary | ICD-10-CM

## 2024-08-30 DIAGNOSIS — I10 Essential (primary) hypertension: Secondary | ICD-10-CM

## 2024-08-30 MED ORDER — IOHEXOL 350 MG/ML SOLN
75.0000 mL | Freq: Once | INTRAVENOUS | Status: AC | PRN
  Administered 2024-08-30: 75 mL via INTRAVENOUS

## 2024-09-06 ENCOUNTER — Encounter: Payer: Self-pay | Admitting: Cardiology

## 2024-09-22 ENCOUNTER — Other Ambulatory Visit: Payer: Self-pay | Admitting: Cardiology

## 2024-10-26 ENCOUNTER — Other Ambulatory Visit: Payer: Self-pay | Admitting: Cardiology
